# Patient Record
Sex: Male | Born: 2000 | Race: Black or African American | Hispanic: No | Marital: Single | Smoking: Never smoker
Health system: Southern US, Community
[De-identification: ages and names within clinical notes are randomized; demographics above are authoritative.]

## PROBLEM LIST (undated history)

## (undated) DIAGNOSIS — J45909 Unspecified asthma, uncomplicated: Secondary | ICD-10-CM

---
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
## 2022-02-27 ENCOUNTER — Other Ambulatory Visit: Payer: Self-pay

## 2022-02-27 ENCOUNTER — Emergency Department (HOSPITAL_BASED_OUTPATIENT_CLINIC_OR_DEPARTMENT_OTHER)
Admission: EM | Admit: 2022-02-27 | Discharge: 2022-02-27 | Disposition: A | Discharge status: Home / Self Care | Payer: Medicaid Other | Attending: Emergency Medicine | Admitting: Emergency Medicine

## 2022-02-27 ENCOUNTER — Encounter (HOSPITAL_BASED_OUTPATIENT_CLINIC_OR_DEPARTMENT_OTHER): Payer: Self-pay

## 2022-02-27 ENCOUNTER — Emergency Department (HOSPITAL_BASED_OUTPATIENT_CLINIC_OR_DEPARTMENT_OTHER): Payer: Medicaid Other

## 2022-02-27 ENCOUNTER — Emergency Department (HOSPITAL_COMMUNITY)
Admission: EM | Admit: 2022-02-27 | Discharge: 2022-02-27 | Discharge status: Against Medical Advice | Payer: Medicaid Other

## 2022-02-27 DIAGNOSIS — M25562 Pain in left knee: Secondary | ICD-10-CM | POA: Diagnosis present

## 2022-02-27 DIAGNOSIS — X501XXA Overexertion from prolonged static or awkward postures, initial encounter: Secondary | ICD-10-CM | POA: Diagnosis not present

## 2022-02-27 DIAGNOSIS — Y9239 Other specified sports and athletic area as the place of occurrence of the external cause: Secondary | ICD-10-CM | POA: Insufficient documentation

## 2022-02-27 DIAGNOSIS — Y9302 Activity, running: Secondary | ICD-10-CM | POA: Diagnosis not present

## 2022-02-27 MED ORDER — IBUPROFEN 800 MG PO TABS
800.0000 mg | ORAL_TABLET | Freq: Once | ORAL | Status: AC
  Administered 2022-02-27: 800 mg via ORAL
  Filled 2022-02-27: qty 1

## 2022-02-27 NOTE — ED Provider Notes (Addendum)
[facility] EMERGENCY DEPARTMENT Provider Note   CSN: [id number] Arrival date & time: 02/27/22  1153     History  Chief Complaint  Patient presents with   Knee Injury   HPI David West is a 21 y.o. male with left knee pain.  Patient stated that yesterday he was running during PE class, planted wrong and his knee "buckled" and he felt a "pop".  Patient stated that he is not been able to bear weight and can only "hobble around".  The knee is painful and he has noticed swelling has worsened since yesterday.  Reports that he can still wiggle his toes on the left side and that sensation is still intact. Taken Tylenol for pain but has not helped much.  Denies any other medical history.      Home Medications Prior to Admission medications   Not on File      Allergies    Patient has no known allergies.    Review of Systems   Review of Systems  Musculoskeletal:  Positive for joint swelling.       Left knee pain    Physical Exam Updated Vital Signs BP 135/78   Pulse (!) 54   Temp 98.1 F (36.7 C) (Oral)   Resp 16   Ht 6' (1.829 m)   Wt 72 kg   SpO2 100%   BMI 21.54 kg/m  Physical Exam Constitutional:      Appearance: Normal appearance.  HENT:     Head: Normocephalic.     Nose: Nose normal.  Eyes:     Conjunctiva/sclera: Conjunctivae normal.  Cardiovascular:     Pulses:          Dorsalis pedis pulses are 2+ on the right side and 2+ on the left side.  Pulmonary:     Effort: Pulmonary effort is normal.  Musculoskeletal:     Left knee: Swelling present. Decreased range of motion. Tenderness present.     Comments: Neurovascularly intact bilaterally  Neurological:     Mental Status: He is alert.  Psychiatric:        Mood and Affect: Mood normal.     ED Results / Procedures / Treatments   Labs (all labs ordered are listed, but only abnormal results are displayed) Labs Reviewed - No data to display  EKG None  Radiology DG Knee Complete 4 Views  Left  Result Date: 02/27/2022 CLINICAL DATA:  Injury EXAM: LEFT KNEE - COMPLETE 4+ VIEW COMPARISON:  Are 08/09/2017 FINDINGS: Negative for fracture.  No significant degenerative change. Large joint effusion again noted as present previously IMPRESSION: Large joint effusion.  Negative for fracture. Electronically Signed   By: Marlan Palau M.D.   On: 02/27/2022 13:02    Procedures Procedures    Medications Ordered in ED Medications  ibuprofen (ADVIL) tablet 800 mg (800 mg Oral Given 02/27/22 1445)    ED Course/ Medical Decision Making/ A&P                           Medical Decision Making Amount and/or Complexity of Data Reviewed Radiology: ordered.  Risk Prescription drug management.   Patient presented for left knee pain.  Given mechanism of injury differential diagnosis includes patellar dislocation, fracture, ligamentous injury, and joint effusion.  Knee x-ray was overall unremarkable but did reveal joint effusion.  Given laxity and inability bear weight there is concern for ligamentous injury.  Gave ibuprofen for pain.  Applied knee immobilizer  to the left knee.  Upon reevaluation patient stated that he felt the same.  Referred to orthopedic surgery in [place].  Discussed return precautions.      Final Clinical Impression(s) / ED Diagnoses Final diagnoses:  Acute pain of left knee    Rx / DC Orders ED Discharge Orders     None         Gareth Eagle, PA-C 02/27/22 1449    Gareth Eagle, PA-C 02/27/22 1450    Arby Barrette, MD 03/01/22 1529

## 2022-02-27 NOTE — ED Triage Notes (Signed)
Patient was running laps and twisted knee yesterday. States felt a pop and is still in pain. +PSCM noted in triage.

## 2022-02-27 NOTE — Discharge Instructions (Addendum)
Diagnosed with left knee pain and swelling.  Given the mechanism of your injury I am concerned for ligamentous tear or sprain.  For this reason, strongly advised that you follow-up with an orthopedic surgery.  Recommend rest, ice, compression, elevation, ibuprofen and Tylenol for pain.
